# Patient Record
Sex: Female | Born: 1970 | Race: White | Hispanic: No | Marital: Married | Smoking: Never smoker
Health system: Southern US, Community
[De-identification: ages and names within clinical notes are randomized; demographics above are authoritative.]

---

## 2014-11-12 ENCOUNTER — Ambulatory Visit
Admission: RE | Admit: 2014-11-12 | Discharge: 2014-11-12 | Disposition: A | Discharge status: Home / Self Care | Payer: Self-pay | Source: Ambulatory Visit | Attending: Family Medicine | Admitting: Family Medicine

## 2014-11-12 ENCOUNTER — Other Ambulatory Visit: Payer: Self-pay | Admitting: Family Medicine

## 2014-11-12 ENCOUNTER — Ambulatory Visit
Admission: RE | Admit: 2014-11-12 | Discharge: 2014-11-12 | Disposition: A | Discharge status: Home / Self Care | Payer: BLUE CROSS/BLUE SHIELD | Source: Ambulatory Visit | Attending: Family Medicine | Admitting: Family Medicine

## 2014-11-12 DIAGNOSIS — R05 Cough: Secondary | ICD-10-CM | POA: Insufficient documentation

## 2014-11-12 DIAGNOSIS — R062 Wheezing: Secondary | ICD-10-CM | POA: Insufficient documentation

## 2014-11-12 DIAGNOSIS — R059 Cough, unspecified: Secondary | ICD-10-CM

## 2017-02-17 ENCOUNTER — Ambulatory Visit
Admission: EM | Admit: 2017-02-17 | Discharge: 2017-02-17 | Disposition: A | Discharge status: Home / Self Care | Payer: BLUE CROSS/BLUE SHIELD | Attending: Family Medicine | Admitting: Family Medicine

## 2017-02-17 ENCOUNTER — Encounter: Payer: Self-pay | Admitting: *Deleted

## 2017-02-17 ENCOUNTER — Ambulatory Visit (INDEPENDENT_AMBULATORY_CARE_PROVIDER_SITE_OTHER): Payer: BLUE CROSS/BLUE SHIELD

## 2017-02-17 DIAGNOSIS — W19XXXA Unspecified fall, initial encounter: Secondary | ICD-10-CM | POA: Diagnosis not present

## 2017-02-17 DIAGNOSIS — S63501A Unspecified sprain of right wrist, initial encounter: Secondary | ICD-10-CM

## 2017-02-17 MED ORDER — NAPROXEN 500 MG PO TABS: 500.0000 mg | ORAL_TABLET | Freq: Two times a day (BID) | ORAL | 0 refills | Status: DC

## 2017-02-17 NOTE — ED Triage Notes (Signed)
Pt fell while roller skating last night, landed on right hand. Now c/o right wrist pain and decreased ROM.

## 2017-02-17 NOTE — ED Provider Notes (Signed)
[facility]    CSN: [id number] Arrival date & time: 02/17/17  [id number]     History   Chief Complaint Chief Complaint  Patient presents with  . Wrist Pain    HPI Regina Gregory is a 46 y.o. female.   HPI  Is a 46 year old female who was rollerskating last night. She states that she had attempted to change direction rolled off of the rink on to the carpet when her wheels caught on the rug  immediately causing her to fall forward onto her outstretched right dominant hand. Last night the hand was throbbing  painful causing her to be awakened about 3 AM. This morning she noticed some palmar ecchymosis and was having pain with movement decided to come in.         History reviewed. No pertinent past medical history.  There are no active problems to display for this patient.   History reviewed. No pertinent surgical history.  OB History    No data available       Home Medications    Prior to Admission medications   Medication Sig Start Date End Date Taking? Authorizing Provider  naproxen (NAPROSYN) 500 MG tablet Take 1 tablet (500 mg total) by mouth 2 (two) times daily with a meal. 02/17/17   Lutricia Feiloemer, Antonina Deziel P, PA-C    Family History Family History  Problem Relation Age of Onset  . Stroke Father   . CAD Father     Social History Social History  Substance Use Topics  . Smoking status: Never Smoker  . Smokeless tobacco: Never Used  . Alcohol use No     Allergies   Patient has no known allergies.   Review of Systems Review of Systems  Constitutional: Positive for activity change. Negative for chills, fatigue and fever.  Musculoskeletal: Positive for arthralgias and myalgias.  All other systems reviewed and are negative.    Physical Exam Triage Vital Signs ED Triage Vitals  Enc Vitals Group     BP 02/17/17 0847 106/78     Pulse Rate 02/17/17 0847 73     Resp 02/17/17 0847 16     Temp 02/17/17 0847 98.1 F (36.7 C)     Temp Source 02/17/17  0847 Oral     SpO2 02/17/17 0847 100 %     Weight 02/17/17 0852 150 lb (68 kg)     Height 02/17/17 0852 5\' 2"  (1.575 m)     Head Circumference --      Peak Flow --      Pain Score 02/17/17 0852 8     Pain Loc --      Pain Edu? --      Excl. in GC? --    No data found.   Updated Vital Signs BP 106/78 (BP Location: Left Arm)   Pulse 73   Temp 98.1 F (36.7 C) (Oral)   Resp 16   Ht 5\' 2"  (1.575 m)   Wt 150 lb (68 kg)   SpO2 100%   BMI 27.44 kg/m   Visual Acuity Right Eye Distance:   Left Eye Distance:   Bilateral Distance:    Right Eye Near:   Left Eye Near:    Bilateral Near:     Physical Exam  Constitutional: She is oriented to person, place, and time. She appears well-developed and well-nourished. No distress.  HENT:  Head: Normocephalic.  Eyes: Pupils are equal, round, and reactive to light.  Neck: Normal range of motion.  Musculoskeletal: She  exhibits edema and tenderness.  Examination of the right dominant hand shows mild swelling distal to the radius and ulna. Is decreased range of motion to flexion and extension lacking approximately 45 respectively. Pronation supination are full and comfortable. Elbow range of motion & shoulder range of motion is full. NMaximal tenderness is in the anatomical snuffbox. Is tenderness at the base of the first metacarpal. There is no crepitus present. No induration is present. Sensation is variable distally. She has good range of motion of the fingers with encouragement. Passively she has full range of motion.  Neurological: She is alert and oriented to person, place, and time.  Skin: Skin is warm and dry. She is not diaphoretic.  Psychiatric: She has a normal mood and affect. Her behavior is normal. Judgment and thought content normal.  Nursing note and vitals reviewed.    UC Treatments / Results  Labs (all labs ordered are listed, but only abnormal results are displayed) Labs Reviewed - No data to display  EKG  EKG  Interpretation None       Radiology Dg Wrist Complete Right  Result Date: 02/17/2017 CLINICAL DATA:  PT with right wrist pain after fall last night. PT states her pain is posterior medial and lateral right wrist pain. PT denies hx of previous injury or trauma. EXAM: RIGHT WRIST - COMPLETE 3+ VIEW COMPARISON:  None. FINDINGS: There is no evidence of fracture or dislocation. There is no evidence of arthropathy or other focal bone abnormality. Soft tissues are unremarkable. IMPRESSION: Negative. Electronically Signed   By: Corlis Leak M.D.   On: 02/17/2017 09:23    Procedures Procedures (including critical care time)  Medications Ordered in UC Medications - No data to display   Initial Impression / Assessment and Plan / UC Course  I have reviewed the triage vital signs and the nursing notes.  Pertinent labs & imaging results that were available during my care of the patient were reviewed by me and considered in my medical decision making (see chart for details).     Plan: 1. Test/x-ray results and diagnosis reviewed with patient 2. rx as per orders; risks, benefits, potential side effects reviewed with patient 3. Recommend supportive treatment with ice applied every 2 hours for 20 minutes 4-5 times daily. Elevate above your heart  today and tomorrow to control swelling and pain. Use a wrist splint for activities and sleeping. May remove the wrist splint for personal care. Patient was instructed in finger range of motion exercises which she should perform frequently. She is given the name of an orthopedic group at that if she continues to have pain in 1-2 weeks she should seek evaluation. 4. F/u prn if symptoms worsen or don't improve   Final Clinical Impressions(s) / UC Diagnoses   Final diagnoses:  Sprain of right wrist, initial encounter    New Prescriptions Discharge Medication List as of 02/17/2017  9:40 AM    START taking these medications   Details  naproxen (NAPROSYN) 500  MG tablet Take 1 tablet (500 mg total) by mouth 2 (two) times daily with a meal., Starting Sun 02/17/2017, Normal         Controlled Substance Prescriptions [place] Controlled Substance Registry consulted? Not Applicable   Lutricia Feil, PA-C 02/17/17 1610

## 2017-11-10 ENCOUNTER — Ambulatory Visit
Admission: EM | Admit: 2017-11-10 | Discharge: 2017-11-10 | Disposition: A | Discharge status: Home / Self Care | Payer: BLUE CROSS/BLUE SHIELD | Attending: Family Medicine | Admitting: Family Medicine

## 2017-11-10 ENCOUNTER — Encounter: Payer: Self-pay | Admitting: Gynecology

## 2017-11-10 ENCOUNTER — Other Ambulatory Visit: Payer: Self-pay

## 2017-11-10 DIAGNOSIS — R300 Dysuria: Secondary | ICD-10-CM

## 2017-11-10 DIAGNOSIS — N390 Urinary tract infection, site not specified: Secondary | ICD-10-CM

## 2017-11-10 DIAGNOSIS — R350 Frequency of micturition: Secondary | ICD-10-CM

## 2017-11-10 LAB — URINALYSIS, COMPLETE (UACMP) WITH MICROSCOPIC: WBC, UA: >50 WBC/hpf (ref 0–5)

## 2017-11-10 MED ORDER — CEPHALEXIN 500 MG PO CAPS: 500.0000 mg | ORAL_CAPSULE | Freq: Two times a day (BID) | ORAL | 0 refills | Status: AC

## 2017-11-10 NOTE — ED Triage Notes (Addendum)
Patient stated hematuria / pressure with urination x last pm. Per patient took over the counter AZO.

## 2017-11-10 NOTE — Discharge Instructions (Addendum)
Take medication as prescribed. Rest. Drink plenty of fluids.  ° °Follow up with your primary care physician this week as needed. Return to Urgent care for new or worsening concerns.  ° °

## 2017-11-10 NOTE — ED Provider Notes (Signed)
[facility] ____________________________________________  Time seen: Approximately 8:32 AM  I have reviewed the triage vital signs and the nursing notes.   HISTORY  Chief Complaint Urinary Tract Infection   HPI Regina Gregory is a 47 y.o. female presenting for evaluation of urinary frequency, urinary urgency and burning with urination present since yesterday.  Patient reports frequent urination disrupted sleep last night.  States did take over-the-counter Azo with some improvement.  States last week she did a "cleanse "and sexually had some diarrhea, and reports this may be a potential trigger.  Denies any other known triggers.  Denies vaginal complaints.  States has had few UTIs in the past, none persistently.  Reports continues to eat and drink overall well.  Denies other aggravating or alleviating factors. Denies recent sickness. Denies recent antibiotic use.   Rayetta Humphrey, MD: PCP Patient's last menstrual period was 11/06/2017.   History reviewed. No pertinent past medical history.  There are no active problems to display for this patient.   History reviewed. No pertinent surgical history.   No current facility-administered medications for this encounter.   Current Outpatient Medications:  .  loratadine (CLARITIN) 10 MG tablet, Take by mouth., Disp: , Rfl:  .  PREVIDENT 5000 ENAMEL PROTECT 1.1-5 % PSTE, BRUSH WITH PASTE 2 TO 3 TIMES PER DAY, DO NOT RINSE, Disp: , Rfl: 5 .  cephALEXin (KEFLEX) 500 MG capsule, Take 1 capsule (500 mg total) by mouth 2 (two) times daily for 7 days., Disp: 14 capsule, Rfl: 0  Allergies Patient has no known allergies.  Family History  Problem Relation Age of Onset  . Stroke Father   . CAD Father     Social History Social History   Tobacco Use  . Smoking status: Never Smoker  . Smokeless tobacco: Never Used  Substance Use Topics  . Alcohol use: No  . Drug use: No    Review of Systems Constitutional: No  fever/chills Cardiovascular: Denies chest pain. Respiratory: Denies shortness of breath. Gastrointestinal: No abdominal pain.  No nausea, no vomiting.  Genitourinary: positive for dysuria. Musculoskeletal: Negative for back pain. Skin: Negative for rash.   ____________________________________________   PHYSICAL EXAM:  VITAL SIGNS: ED Triage Vitals  Enc Vitals Group     BP 11/10/17 0812 123/86     Pulse Rate 11/10/17 0812 99     Resp 11/10/17 0812 16     Temp 11/10/17 0812 98.7 F (37.1 C)     Temp Source 11/10/17 0812 Oral     SpO2 11/10/17 0812 99 %     Weight 11/10/17 0811 150 lb (68 kg)     Height 11/10/17 0811  (1.575 m)     Head Circumference --      Peak Flow --      Pain Score 11/10/17 0811 10     Pain Loc --      Pain Edu? --      Excl. in GC? --     Constitutional: Alert and oriented. Well appearing and in no acute distress. ENT      Head: Normocephalic and atraumatic. Cardiovascular: Normal rate, regular rhythm. Grossly normal heart sounds.  Good peripheral circulation. Respiratory: Normal respiratory effort without tachypnea nor retractions. Breath sounds are clear and equal bilaterally. No wheezes, rales, rhonchi. Gastrointestinal: Soft and nontender.  Musculoskeletal: No midline cervical, thoracic or lumbar tenderness to palpation.  Neurologic:  Normal speech and language. Speech is normal. No gait instability.  Skin:  Skin is warm, dry.  Psychiatric: Mood and affect are normal. Speech and behavior are normal. Patient exhibits appropriate insight and judgment   ___________________________________________   LABS (all labs ordered are listed, but only abnormal results are displayed)  Labs Reviewed  URINALYSIS, COMPLETE (UACMP) WITH MICROSCOPIC - Abnormal; Notable for the following components:      Result Value   Color, Urine ORANGE (*)    APPearance CLOUDY (*)    Glucose, UA   (*)    Value: TEST NOT REPORTED DUE TO COLOR INTERFERENCE OF URINE  PIGMENT   Hgb urine dipstick   (*)    Value: TEST NOT REPORTED DUE TO COLOR INTERFERENCE OF URINE PIGMENT   Bilirubin Urine   (*)    Value: TEST NOT REPORTED DUE TO COLOR INTERFERENCE OF URINE PIGMENT   Ketones, ur   (*)    Value: TEST NOT REPORTED DUE TO COLOR INTERFERENCE OF URINE PIGMENT   Protein, ur   (*)    Value: TEST NOT REPORTED DUE TO COLOR INTERFERENCE OF URINE PIGMENT   Nitrite   (*)    Value: TEST NOT REPORTED DUE TO COLOR INTERFERENCE OF URINE PIGMENT   Leukocytes, UA   (*)    Value: TEST NOT REPORTED DUE TO COLOR INTERFERENCE OF URINE PIGMENT   Bacteria, UA MANY (*)    All other components within normal limits  URINE CULTURE   ____________________________________________   PROCEDURES Procedures   INITIAL IMPRESSION / ASSESSMENT AND PLAN / ED COURSE  Pertinent labs & imaging results that were available during my care of the patient were reviewed by me and considered in my medical decision making (see chart for details).  Well-appearing patient.  No acute distress.  Urinalysis reviewed, suspect UTI.  We will culture urine.  Will empirically start on oral Keflex.  Encourage rest, fluids, supportive care.Discussed indication, risks and benefits of medications with patient.  Discussed follow up with Primary care physician this week. Discussed follow up and return parameters including no resolution or any worsening concerns. Patient verbalized understanding and agreed to plan.   ____________________________________________   FINAL CLINICAL IMPRESSION(S) / ED DIAGNOSES  Final diagnoses:  Urinary tract infection without hematuria, site unspecified     ED Discharge Orders        Ordered    cephALEXin (KEFLEX) 500 MG capsule  2 times daily     11/10/17 3244       Note: This dictation was prepared with Dragon dictation along with smaller phrase technology. Any transcriptional errors that result from this process are unintentional.         Renford Dills,  NP 11/10/17 1002

## 2017-11-13 LAB — URINE CULTURE: Culture: >=100000 — AB

## 2017-11-14 ENCOUNTER — Telehealth (HOSPITAL_COMMUNITY): Payer: Self-pay

## 2017-11-14 NOTE — Telephone Encounter (Signed)
Urine culture was positive for E.Coli and was given Keflex at urgent care visit.Attempted to reach patient. No answer at this time.  

## 2018-04-05 ENCOUNTER — Ambulatory Visit
Admission: EM | Admit: 2018-04-05 | Discharge: 2018-04-05 | Disposition: A | Discharge status: Home / Self Care | Payer: BLUE CROSS/BLUE SHIELD | Attending: Internal Medicine | Admitting: Internal Medicine

## 2018-04-05 ENCOUNTER — Other Ambulatory Visit: Payer: Self-pay

## 2018-04-05 DIAGNOSIS — Y93H9 Activity, other involving exterior property and land maintenance, building and construction: Secondary | ICD-10-CM

## 2018-04-05 DIAGNOSIS — S0502XA Injury of conjunctiva and corneal abrasion without foreign body, left eye, initial encounter: Secondary | ICD-10-CM | POA: Diagnosis not present

## 2018-04-05 MED ORDER — CIPROFLOXACIN HCL 0.3 % OP SOLN: OPHTHALMIC | 0 refills | Status: DC

## 2018-04-05 NOTE — ED Triage Notes (Addendum)
Pt states left eye feels like it has a film on it. Eyelid is sore. Mowed grass on Thursday and it started after that  VISUAL ACUITY Uncorrected Right eye 20/25 Uncorrected Left eye 20/15 -1 Uncorrected Both eyes 20/15 -1

## 2018-04-05 NOTE — ED Provider Notes (Signed)
[facility]    CSN: [id number] Arrival date & time: 04/05/18  1014     History   Chief Complaint Chief Complaint  Patient presents with  . Eye Problem    HPI Regina Gregory is a 47 y.o. female.   Pt cut grass 2 days ago and the next day she woke up with medial eye area redness, slight cloudy film in this area, and if she touches her medial lid is a little tender. This am, her eye looks the same. She does not recall getting anything in her eye. Has a very mild sensation there is something on medial area.  Has had constant tearing of L eye and was worse yesterday. Denies photophobia. Does not wear contacts. Denies matting of her eyes lashes this am. Denies vision changes     History reviewed. No pertinent past medical history.  There are no active problems to display for this patient.   History reviewed. No pertinent surgical history.  OB History   None      Home Medications    Prior to Admission medications   Medication Sig Start Date End Date Taking? Authorizing Provider  ciprofloxacin (CILOXAN) 0.3 % ophthalmic solution Administer 1 drop, every 2 hours, while awake, for 24 hours. Then 1 drop, every 12 hours, while awake, for the next 5 days. 04/05/18   Rodriguez-Southworth, Nettie Elm, PA-C    Family History Family History  Problem Relation Age of Onset  . Stroke Father   . CAD Father     Social History Social History   Tobacco Use  . Smoking status: Never Smoker  . Smokeless tobacco: Never Used  Substance Use Topics  . Alcohol use: No  . Drug use: No   She is married  Allergies   Patient has no known allergies.   Review of Systems Review of Systems  Constitutional: Negative for chills and fever.  HENT: Positive for postnasal drip and sneezing.        Mild, but her allergy medication is controlling her allergies act up.   Eyes: Positive for redness. Negative for photophobia, pain, discharge, itching and visual disturbance.    Respiratory: Negative for cough, shortness of breath and wheezing.   Cardiovascular: Negative for chest pain.  Gastrointestinal: Negative for nausea.  Skin: Negative for rash.  Allergic/Immunologic: Positive for environmental allergies.  Neurological: Positive for headaches.       Has chronic HA's and are not worse than her usual      Physical Exam Triage Vital Signs ED Triage Vitals  Enc Vitals Group     BP 04/05/18 1027 132/80     Pulse Rate 04/05/18 1027 77     Resp 04/05/18 1027 16     Temp 04/05/18 1027 98.4 F (36.9 C)     Temp Source 04/05/18 1027 Oral     SpO2 04/05/18 1027 100 %     Weight 04/05/18 1029 150 lb (68 kg)     Height 04/05/18 1029 5\' 2"  (1.575 m)     Head Circumference --      Peak Flow --      Pain Score 04/05/18 1028 1     Pain Loc --      Pain Edu? --      Excl. in GC? --    No data found.  Updated Vital Signs BP 132/80 (BP Location: Left Arm)   Pulse 77   Temp 98.4 F (36.9 C) (Oral)   Resp 16   Ht 5'  2" (1.575 m)   Wt 150 lb (68 kg)   LMP 03/12/2018   SpO2 100%   BMI 27.44 kg/m   Visual Acuity Right Eye Distance:  20/25 Left Eye Distance:  20/15-1 Bilateral Distance:  20/15-1  Right Eye Near:   Left Eye Near:    Bilateral Near:     Physical Exam  Constitutional: She is oriented to person, place, and time. She appears well-developed and well-nourished. No distress.  HENT:  Head: Normocephalic.  Nose: Nose normal.  Eyes: Pupils are equal, round, and reactive to light. EOM and lids are normal. Lids are everted and swept, no foreign bodies found. Right eye exhibits no discharge. Left eye exhibits no discharge and no hordeolum. No foreign body present in the left eye. Left conjunctiva is injected. Left conjunctiva has no hemorrhage. No scleral icterus.  Slit lamp exam:      The left eye shows corneal abrasion and fluorescein uptake. The left eye shows no corneal ulcer and no foreign body.  The fluorecine uptake is present at the  medial border of the iris. The conjunctiva injection is only medially.   Neck: Neck supple.  Pulmonary/Chest: Effort normal.  Lymphadenopathy:    She has no cervical adenopathy.  Neurological: She is alert and oriented to person, place, and time.  Skin: Skin is dry. No rash noted. She is not diaphoretic.   UC Treatments / Results  Labs (all labs ordered are listed, but only abnormal results are displayed) Labs Reviewed - No data to display  EKG None  Radiology No results found.  Procedures Fluorecin eye strain and woods lamp  Medications Ordered in UC Medications - No data to display  Initial Impression / Assessment and Plan / UC Course  I have reviewed the triage vital signs and the nursing notes. I reviewed symptoms of iritis and what to watch for and if this happens needs to see eye MD or go to ER.       Final Clinical Impressions(s) / UC Diagnoses   Final diagnoses:  Abrasion of left cornea, initial encounter     Discharge Instructions     If you develop worsening of redness light sensitivity you need to go to see an eye doctor or go to the emergency room.     ED Prescriptions    Medication Sig Dispense Auth. Provider   ciprofloxacin (CILOXAN) 0.3 % ophthalmic solution Administer 1 drop, every 2 hours, while awake, for 24 hours. Then 1 drop, every 12 hours, while awake, for the next 5 days. 5 mL Rodriguez-Southworth, Nettie Elm, PA-C     Controlled Substance Prescriptions [place] Controlled Substance Registry consulted?    Garey Ham, PA-C 04/05/18 1145

## 2018-04-05 NOTE — Discharge Instructions (Addendum)
If you develop worsening of redness light sensitivity you need to go to see an eye doctor or go to the emergency room.

## 2019-01-19 ENCOUNTER — Ambulatory Visit: Payer: BLUE CROSS/BLUE SHIELD | Admitting: Podiatry

## 2019-01-22 ENCOUNTER — Other Ambulatory Visit: Payer: Self-pay | Admitting: Podiatry

## 2019-01-22 ENCOUNTER — Encounter: Payer: Self-pay | Admitting: Podiatry

## 2019-01-22 ENCOUNTER — Ambulatory Visit (INDEPENDENT_AMBULATORY_CARE_PROVIDER_SITE_OTHER): Payer: BC Managed Care – PPO

## 2019-01-22 ENCOUNTER — Other Ambulatory Visit: Payer: Self-pay

## 2019-01-22 ENCOUNTER — Ambulatory Visit (INDEPENDENT_AMBULATORY_CARE_PROVIDER_SITE_OTHER): Payer: BC Managed Care – PPO | Admitting: Podiatry

## 2019-01-22 VITALS — Temp 97.2°F

## 2019-01-22 DIAGNOSIS — M722 Plantar fascial fibromatosis: Secondary | ICD-10-CM

## 2019-01-22 DIAGNOSIS — M2140 Flat foot [pes planus] (acquired), unspecified foot: Secondary | ICD-10-CM | POA: Insufficient documentation

## 2019-01-22 DIAGNOSIS — M2022 Hallux rigidus, left foot: Secondary | ICD-10-CM

## 2019-01-22 DIAGNOSIS — M2141 Flat foot [pes planus] (acquired), right foot: Secondary | ICD-10-CM

## 2019-01-22 DIAGNOSIS — M2142 Flat foot [pes planus] (acquired), left foot: Secondary | ICD-10-CM

## 2019-01-22 DIAGNOSIS — M2021 Hallux rigidus, right foot: Secondary | ICD-10-CM

## 2019-01-22 DIAGNOSIS — M205X2 Other deformities of toe(s) (acquired), left foot: Secondary | ICD-10-CM | POA: Insufficient documentation

## 2019-01-22 MED ORDER — MELOXICAM 15 MG PO TABS: 15.0000 mg | ORAL_TABLET | Freq: Every day | ORAL | 0 refills | Status: DC

## 2019-01-22 NOTE — Progress Notes (Signed)
This patient presents the office with chief complaint of painful big toes on both feet she states that most of her pain occurs in her big toe joint both feet after she walks.  She says she experiences pain in the back of both heels at different times.  The heel pain is not constant.  Pain is present rising in the morning. 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  She says that she is an avid walker and this pain appears to be worsening in her big toe joint both feet .  She has tried compression socks and a change of shoes but her pain continues to worsen.  She presents the office today for an evaluation and treatment of her painful big toe joint both feet.  Vascular  Dorsalis pedis and posterior tibial pulses are palpable  B/L.  Capillary return  WNL.  Temperature gradient is  WNL.  Skin turgor  WNL  Sensorium  Senn Weinstein monofilament wire  WNL. Normal tactile sensation.  Nail Exam  Patient has normal nails with no evidence of bacterial or fungal infection.  Orthopedic  Exam  Muscle tone and muscle strength  WNL.    No crepitus or joint effusion noted.  Foot type is unremarkable and digits show no abnormalities.  Examination of both feet reveal bony prominence noted on the dorsum of the first metatarsal both feet.  She has approximately 10 degrees of dorsiflexion in her left big toe joint and 25 degrees dorsiflexion in the first MPJ of her right foot.  Mild palpable pain noted in the heels bilaterally.  Skin  No open lesions.  Normal skin texture and turgor.  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  Plantar fasciitis  B/L.  IE.  X-rays reveal a narrowing noted of the first MPJ bilaterally.  The lateral x-rays do reveal bone reactivity noted on the dorsum of the first metatarsal bilaterally.  No evidence of any calcification at the insertion of the plantar fascia fascia bilaterally.  Discussed this condition with this patient.  We  discussed conservative treatment which  included prescribing power step insoles and prescribing Mobic by mouth for  2 weeks.  RTC 2 weeks.  To consider kinetic wedge orthoses and  possible surgery.    Gardiner Barefoot DPM

## 2019-02-19 ENCOUNTER — Encounter: Payer: Self-pay | Admitting: Podiatry

## 2019-02-19 ENCOUNTER — Other Ambulatory Visit: Payer: Self-pay

## 2019-02-19 ENCOUNTER — Ambulatory Visit (INDEPENDENT_AMBULATORY_CARE_PROVIDER_SITE_OTHER): Payer: BC Managed Care – PPO | Admitting: Podiatry

## 2019-02-19 DIAGNOSIS — M205X1 Other deformities of toe(s) (acquired), right foot: Secondary | ICD-10-CM

## 2019-02-19 DIAGNOSIS — M722 Plantar fascial fibromatosis: Secondary | ICD-10-CM | POA: Diagnosis not present

## 2019-02-19 DIAGNOSIS — M2021 Hallux rigidus, right foot: Secondary | ICD-10-CM

## 2019-02-19 DIAGNOSIS — M2022 Hallux rigidus, left foot: Secondary | ICD-10-CM

## 2019-02-19 NOTE — Progress Notes (Signed)
This patient presents the office with diagnosis of plantar fasciitis secondary to a functional hallux limitus.  Patient was treated with power step insoles and Mobic.  She says that she has been faithfully wearing the insoles and taken the Mobic and has seen no improvement in her foot.  She says that her feet are still painful walking.  She presents the office today for continued evaluation and treatment of her painful feet.  Vascular  Dorsalis pedis and posterior tibial pulses are palpable  B/L.  Capillary return  WNL.  Temperature gradient is  WNL.  Skin turgor  WNL  Sensorium  Senn Weinstein monofilament wire  WNL. Normal tactile sensation.  Nail Exam  Patient has normal nails with no evidence of bacterial or fungal infection.  Orthopedic  Exam  Muscle tone and muscle strength  WNL.    No crepitus or joint effusion noted.  Foot type is unremarkable and digits show no abnormalities.  Examination of both feet reveal bony prominence noted on the dorsum of the first metatarsal both feet.  She has approximately 10 degrees of dorsiflexion in her left big toe joint and 25 degrees dorsiflexion in the first MPJ of her right foot.  Mild palpable pain noted in the heels bilaterally.  Skin  No open lesions.  Normal skin texture and turgor.  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  Plantar fasciitis  B/L  ROV.  Discussed this condition with this patient.  Told her that her true pathology is located in her big toe joints both feet.  Therefore I recommended that she consider kinetic wedge orthotics to be made by the pedorthist.  She is to make an appointment on her way out for an appointment with  The pedorthist.   Patient is not interested in surgical correction today.     Gardiner Barefoot DPM

## 2019-03-03 IMAGING — CR DG WRIST COMPLETE 3+V*R*
4 series · 4 of 4 positions shown · non-contrast
Comparison: None.

CLINICAL DATA: PT with right wrist pain after fall last night. PT
states her pain is posterior medial and lateral right wrist pain. PT
denies hx of previous injury or trauma.

EXAM:
RIGHT WRIST - COMPLETE 3+ VIEW

[wrist pa]
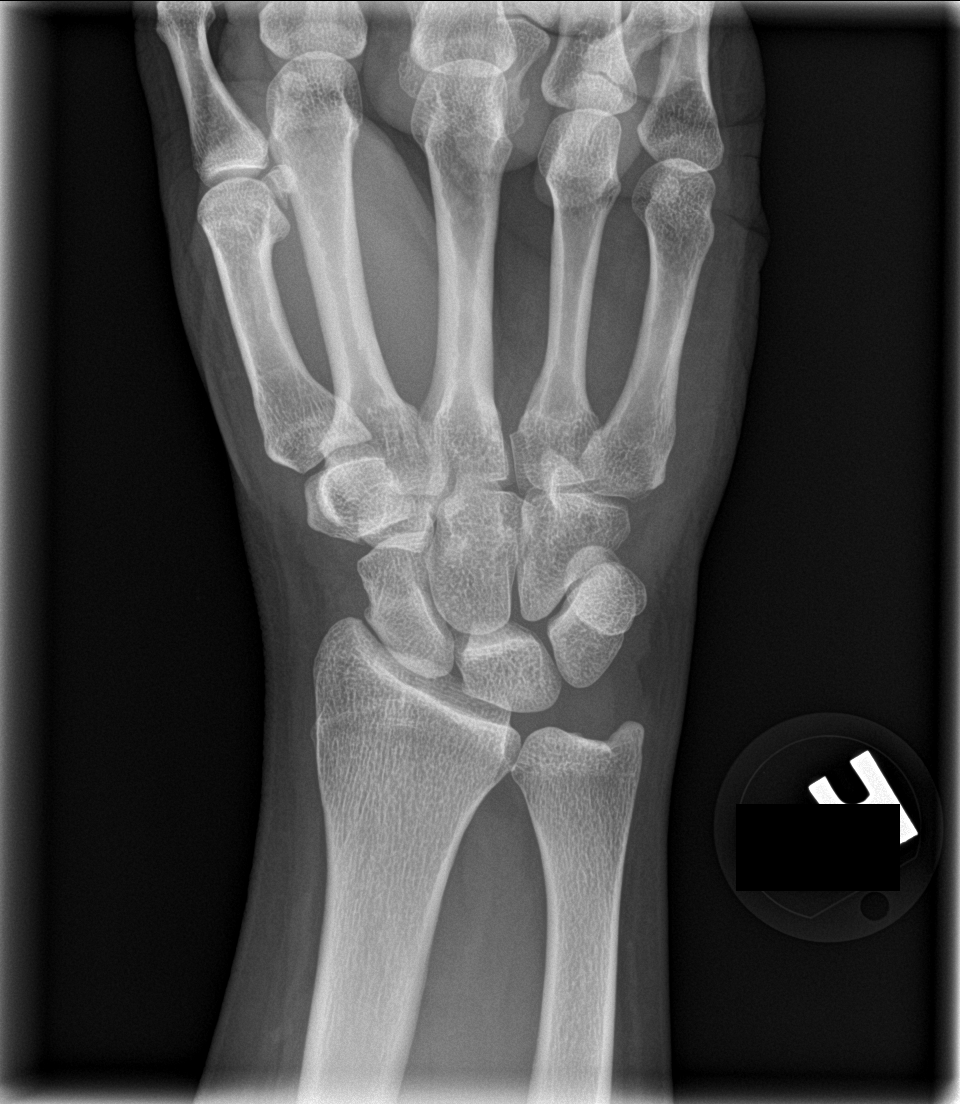

[wrist obl]
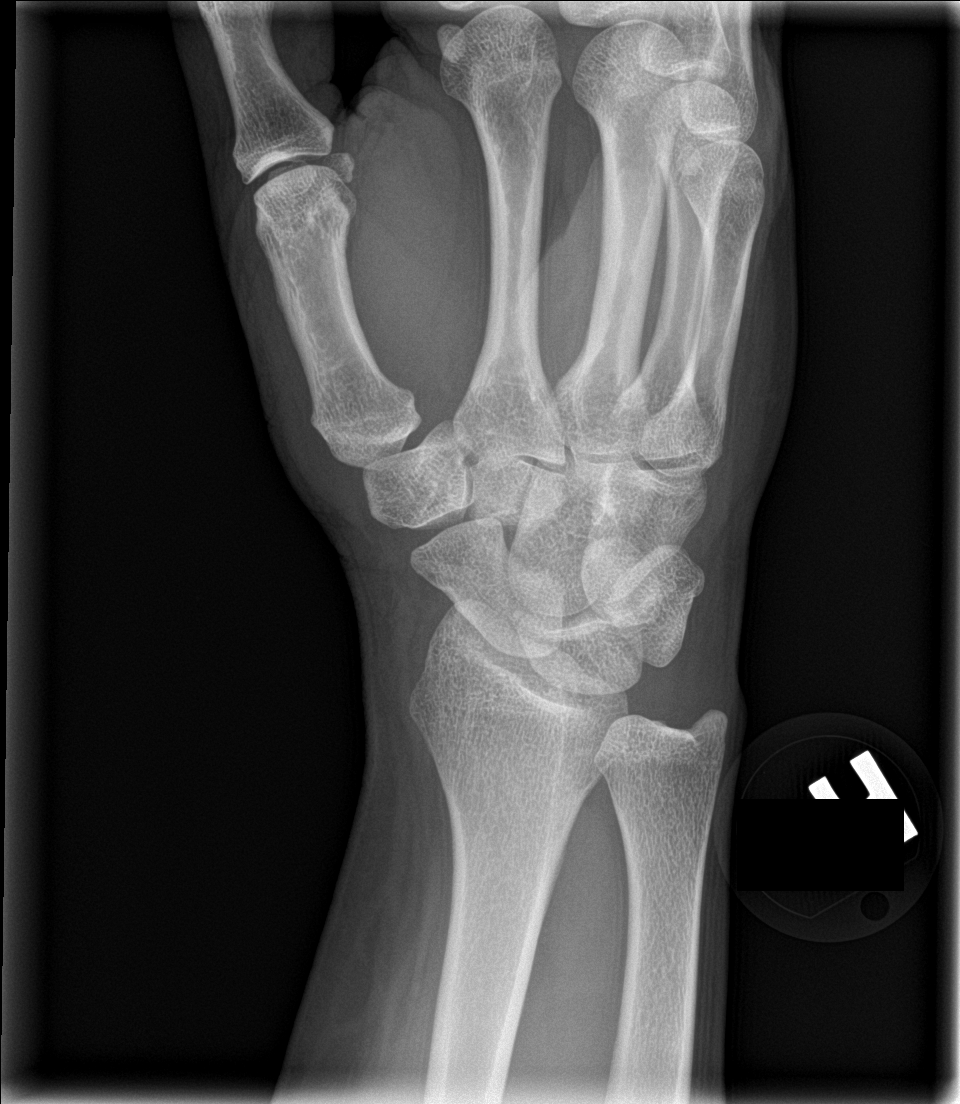

[wrist lat]
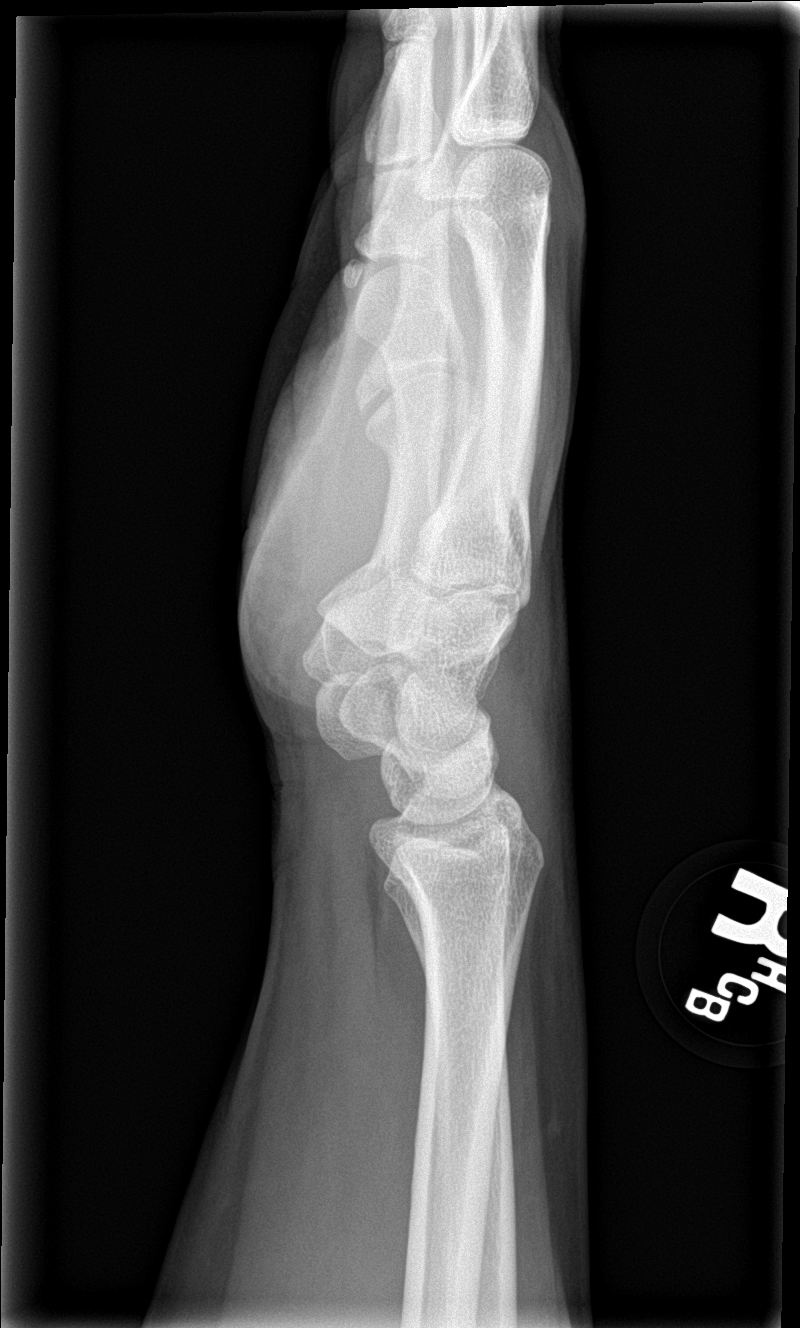

[wrist navicular]
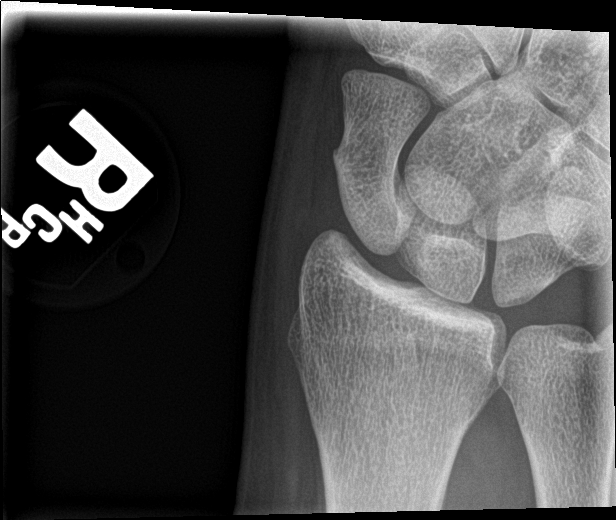

[4 of 4 positions shown; findings below may reference images not displayed]

FINDINGS: There is no evidence of fracture or dislocation. There is no
evidence of arthropathy or other focal bone abnormality. Soft
tissues are unremarkable.
IMPRESSION: Negative.

## 2019-03-04 ENCOUNTER — Other Ambulatory Visit: Payer: Self-pay

## 2019-03-04 ENCOUNTER — Ambulatory Visit (INDEPENDENT_AMBULATORY_CARE_PROVIDER_SITE_OTHER): Payer: BC Managed Care – PPO | Admitting: Orthotics

## 2019-03-04 DIAGNOSIS — M2022 Hallux rigidus, left foot: Secondary | ICD-10-CM

## 2019-03-04 DIAGNOSIS — M2142 Flat foot [pes planus] (acquired), left foot: Secondary | ICD-10-CM

## 2019-03-04 DIAGNOSIS — M722 Plantar fascial fibromatosis: Secondary | ICD-10-CM

## 2019-03-04 DIAGNOSIS — M2141 Flat foot [pes planus] (acquired), right foot: Secondary | ICD-10-CM

## 2019-03-04 DIAGNOSIS — M205X1 Other deformities of toe(s) (acquired), right foot: Secondary | ICD-10-CM

## 2019-03-04 DIAGNOSIS — M205X2 Other deformities of toe(s) (acquired), left foot: Secondary | ICD-10-CM

## 2019-03-04 DIAGNOSIS — M2021 Hallux rigidus, right foot: Secondary | ICD-10-CM

## 2019-03-04 NOTE — Progress Notes (Signed)
Patient came into today for casting bilateral f/o to address plantar fasciitis.  Patient reports history of foot pain involving plantar aponeurosis.  Goal is to provide longitudinal arch support and correct any RF instability due to heel eversion/inversion.  Ultimate goal is to relieve tension at pf insertion calcaneal tuberosity.  Plan on semi-rigid device addressing heel stability and relieving PF tension.      Added k-wedges

## 2019-04-08 ENCOUNTER — Other Ambulatory Visit: Payer: Self-pay

## 2019-04-08 ENCOUNTER — Ambulatory Visit: Payer: BC Managed Care – PPO | Admitting: Orthotics

## 2019-04-08 DIAGNOSIS — M722 Plantar fascial fibromatosis: Secondary | ICD-10-CM

## 2019-04-08 NOTE — Progress Notes (Signed)
Patient came in today to pick up custom made foot orthotics.  The goals were accomplished and the patient reported no dissatisfaction with said orthotics.  Patient was advised of breakin period and how to report any issues. 

## 2021-02-26 ENCOUNTER — Ambulatory Visit
Admission: EM | Admit: 2021-02-26 | Discharge: 2021-02-26 | Disposition: A | Discharge status: Home / Self Care | Payer: BC Managed Care – PPO | Attending: Family Medicine | Admitting: Family Medicine

## 2021-02-26 ENCOUNTER — Encounter: Payer: Self-pay | Admitting: Emergency Medicine

## 2021-02-26 ENCOUNTER — Other Ambulatory Visit: Payer: Self-pay

## 2021-02-26 DIAGNOSIS — J209 Acute bronchitis, unspecified: Secondary | ICD-10-CM

## 2021-02-26 MED ORDER — PROMETHAZINE-DM 6.25-15 MG/5ML PO SYRP: 5.0000 mL | ORAL_SOLUTION | Freq: Four times a day (QID) | ORAL | 0 refills | Status: AC | PRN

## 2021-02-26 MED ORDER — PREDNISONE 50 MG PO TABS: ORAL_TABLET | ORAL | 0 refills | Status: AC

## 2021-02-26 MED ORDER — DOXYCYCLINE HYCLATE 100 MG PO CAPS: 100.0000 mg | ORAL_CAPSULE | Freq: Two times a day (BID) | ORAL | 0 refills | Status: AC

## 2021-02-26 NOTE — ED Provider Notes (Signed)
[facility]    CSN: [id number] Arrival date & time: 02/26/21  0805      History   Chief Complaint Chief Complaint  Patient presents with  . Cough  . Generalized Body Aches    HPI 50 year old female presents with the above complaints.  Patient states that she has been sick for the past week.  Reports cough, sore throat, body aches, headache, congestion.  She is most bothered by the cough which keeps her up at night.  She states that she has had posttussive emesis.  No relieving factors.  Pain 5/10 in severity.  Denies fever.  No other complaints.  Patient Active Problem List   Diagnosis Date Noted  . Acquired hallux limitus of both feet 01/22/2019  . Plantar fasciitis, bilateral 01/22/2019  . Pes planus 01/22/2019   OB History   No obstetric history on file.      Home Medications    Prior to Admission medications   Medication Sig Start Date End Date Taking? Authorizing Provider  doxycycline (VIBRAMYCIN) 100 MG capsule Take 1 capsule (100 mg total) by mouth 2 (two) times daily. 02/26/21  Yes Jaylissa Felty G, DO  predniSONE (DELTASONE) 50 MG tablet 1 tablet daily x 5 days 02/26/21  Yes Azjah Pardo G, DO  promethazine-dextromethorphan (PROMETHAZINE-DM) 6.25-15 MG/5ML syrup Take 5 mLs by mouth 4 (four) times daily as needed for cough. 02/26/21  Yes Tommie Sams, DO    Family History Family History  Problem Relation Age of Onset  . Stroke Father   . CAD Father     Social History Social History   Tobacco Use  . Smoking status: Never  . Smokeless tobacco: Never  Vaping Use  . Vaping Use: Never used  Substance Use Topics  . Alcohol use: No  . Drug use: No     Allergies   Patient has no known allergies.   Review of Systems Review of Systems Per HPI  Physical Exam Triage Vital Signs ED Triage Vitals  Enc Vitals Group     BP 02/26/21 0817 (!) 131/94     Pulse Rate 02/26/21 0817 (!) 109     Resp 02/26/21 0817 18     Temp 02/26/21 0817 98.9 F  (37.2 C)     Temp Source 02/26/21 0817 Oral     SpO2 02/26/21 0817 100 %     Weight 02/26/21 0818 149 lb 14.6 oz (68 kg)     Height 02/26/21 0818 5\' 2"  (1.575 m)     Head Circumference --      Peak Flow --      Pain Score 02/26/21 0818 5     Pain Loc --      Pain Edu? --      Excl. in GC? --    Updated Vital Signs BP (!) 131/94 (BP Location: Left Arm)   Pulse (!) 109   Temp 98.9 F (37.2 C) (Oral)   Resp 18   Ht 5\' 2"  (1.575 m)   Wt 68 kg   LMP 02/23/2021 (Approximate)   SpO2 100%   BMI 27.42 kg/m   Visual Acuity Right Eye Distance:   Left Eye Distance:   Bilateral Distance:    Right Eye Near:   Left Eye Near:    Bilateral Near:     Physical Exam Vitals and nursing note reviewed.  Constitutional:      General: She is not in acute distress.    Appearance: Normal appearance. She is not  ill-appearing.  HENT:     Head: Normocephalic and atraumatic.     Right Ear: Tympanic membrane normal.     Left Ear: Tympanic membrane normal.     Mouth/Throat:     Pharynx: Oropharynx is clear.  Eyes:     General:        Right eye: No discharge.        Left eye: No discharge.     Conjunctiva/sclera: Conjunctivae normal.  Cardiovascular:     Rate and Rhythm: Regular rhythm. Tachycardia present.  Pulmonary:     Effort: Pulmonary effort is normal.     Breath sounds: Normal breath sounds. No wheezing, rhonchi or rales.  Neurological:     Mental Status: She is alert.   UC Treatments / Results  Labs (all labs ordered are listed, but only abnormal results are displayed) Labs Reviewed - No data to display  EKG   Radiology No results found.  Procedures Procedures (including critical care time)  Medications Ordered in UC Medications - No data to display  Initial Impression / Assessment and Plan / UC Course  I have reviewed the triage vital signs and the nursing notes.  Pertinent labs & imaging results that were available during my care of the patient were reviewed by  me and considered in my medical decision making (see chart for details).    50 year old female presents with acute bronchitis.  Treating with doxycycline, prednisone, and Promethazine DM.  Final Clinical Impressions(s) / UC Diagnoses   Final diagnoses:  Acute bronchitis, unspecified organism   Discharge Instructions   None    ED Prescriptions     Medication Sig Dispense Auth. Provider   doxycycline (VIBRAMYCIN) 100 MG capsule Take 1 capsule (100 mg total) by mouth 2 (two) times daily. 14 capsule Kwynn Schlotter G, DO   predniSONE (DELTASONE) 50 MG tablet 1 tablet daily x 5 days 5 tablet Fruma Africa G, DO   promethazine-dextromethorphan (PROMETHAZINE-DM) 6.25-15 MG/5ML syrup Take 5 mLs by mouth 4 (four) times daily as needed for cough. 118 mL Tommie Sams, DO      PDMP not reviewed this encounter.   Tommie Sams, [place] 02/26/21 [phone]

## 2021-02-26 NOTE — ED Triage Notes (Signed)
Pt c/o cough, body aches, sore throat, and headache. Started about 6 days ago. She states she took a home covid test and was negative. Declines covid test today.
